# Patient Record
Sex: Male | Born: 1990 | Race: White | Hispanic: No | Marital: Single | Smoking: Former smoker
Health system: Southern US, Community
[De-identification: ages and names within clinical notes are randomized; demographics above are authoritative.]

## PROBLEM LIST (undated history)

## (undated) HISTORY — PX: FOOT SURGERY: SHX648

---
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
## 2010-02-24 ENCOUNTER — Emergency Department: Payer: Self-pay | Admitting: Emergency Medicine

## 2010-09-14 ENCOUNTER — Emergency Department: Payer: Self-pay | Admitting: Emergency Medicine

## 2011-02-04 ENCOUNTER — Emergency Department: Payer: Self-pay | Admitting: Emergency Medicine

## 2012-08-10 ENCOUNTER — Emergency Department: Payer: Self-pay | Admitting: Emergency Medicine

## 2014-08-10 ENCOUNTER — Ambulatory Visit: Payer: Self-pay

## 2015-07-14 ENCOUNTER — Ambulatory Visit
Admission: EM | Admit: 2015-07-14 | Discharge: 2015-07-14 | Disposition: A | Discharge status: Home / Self Care | Payer: BLUE CROSS/BLUE SHIELD | Attending: Family Medicine | Admitting: Family Medicine

## 2015-07-14 ENCOUNTER — Encounter: Payer: Self-pay | Admitting: Emergency Medicine

## 2015-07-14 ENCOUNTER — Ambulatory Visit: Payer: BLUE CROSS/BLUE SHIELD

## 2015-07-14 DIAGNOSIS — S6391XA Sprain of unspecified part of right wrist and hand, initial encounter: Secondary | ICD-10-CM | POA: Diagnosis not present

## 2015-07-14 DIAGNOSIS — T148 Other injury of unspecified body region: Secondary | ICD-10-CM

## 2015-07-14 DIAGNOSIS — M79641 Pain in right hand: Secondary | ICD-10-CM | POA: Diagnosis present

## 2015-07-14 DIAGNOSIS — T148XXA Other injury of unspecified body region, initial encounter: Secondary | ICD-10-CM

## 2015-07-14 DIAGNOSIS — S6991XA Unspecified injury of right wrist, hand and finger(s), initial encounter: Secondary | ICD-10-CM

## 2015-07-14 DIAGNOSIS — W109XXA Fall (on) (from) unspecified stairs and steps, initial encounter: Secondary | ICD-10-CM | POA: Insufficient documentation

## 2015-07-14 MED ORDER — OXYCODONE-ACETAMINOPHEN 2.5-325 MG PO TABS: 1.0000 | ORAL_TABLET | Freq: Four times a day (QID) | ORAL | Status: AC | PRN

## 2015-07-14 NOTE — ED Notes (Addendum)
Fell down stairs today injured thumb on right hand. Cannot move thumb, swollen and painful

## 2015-07-14 NOTE — ED Provider Notes (Signed)
Patient presents today with history of falling down the stairs earlier today. Patient states that he injured his right hand and first digit. He has noticed that his thumb has been swelling and is more tender now. He denies any previous injury to the hand or thumb in the past.  ROS: Negative except mentioned above. Vitals as per EPIC  GENERAL: NAD RESP: CTA B CARD: RRR MSK : Right Hand- moderate swelling and tenderness to the first metacarpal area and first digit, decreased range of motion due to pain and swelling, and NV intact, unable to assess ulnar collateral ligament disruption due to swelling and pain  NEURO: CN II-XII grossly intact   A/P: Right hand injury-x-rays do not show an acute fracture, will treat injury as a sprain and contusion, recommend patient ice the area frequently and take ibuprofen and Percocet if needed for increased pain. He is to use the thumb spica splint as well. He'll return on Wednesday so I can reevaluate his hand and see if he has any ligamentous injury that would require orthopedic evaluation/treatment. If his symptoms do get worse he is to seek immediate medical attention as discussed.  Jolene Provost, MD 07/14/15 [phone]

## 2016-04-14 ENCOUNTER — Emergency Department
Admission: EM | Admit: 2016-04-14 | Discharge: 2016-04-14 | Disposition: A | Discharge status: Home / Self Care | Payer: BLUE CROSS/BLUE SHIELD | Attending: Emergency Medicine | Admitting: Emergency Medicine

## 2016-04-14 ENCOUNTER — Emergency Department: Payer: BLUE CROSS/BLUE SHIELD

## 2016-04-14 DIAGNOSIS — R079 Chest pain, unspecified: Secondary | ICD-10-CM

## 2016-04-14 DIAGNOSIS — R0789 Other chest pain: Secondary | ICD-10-CM | POA: Insufficient documentation

## 2016-04-14 DIAGNOSIS — Z87891 Personal history of nicotine dependence: Secondary | ICD-10-CM | POA: Insufficient documentation

## 2016-04-14 LAB — CBC
HEMATOCRIT: 43.2 % (ref 40.0–52.0)
HEMOGLOBIN: 15.1 g/dL (ref 13.0–18.0)
MCH: 29.8 pg (ref 26.0–34.0)
MCHC: 35 g/dL (ref 32.0–36.0)
MCV: 85.1 fL (ref 80.0–100.0)
Platelets: 235 10*3/uL (ref 150–440)
RBC: 5.08 MIL/uL (ref 4.40–5.90)
RDW: 13.3 % (ref 11.5–14.5)
WBC: 10.5 10*3/uL (ref 3.8–10.6)

## 2016-04-14 LAB — BASIC METABOLIC PANEL
ANION GAP: 7 (ref 5–15)
BUN: 16 mg/dL (ref 6–20)
CHLORIDE: 105 mmol/L (ref 101–111)
CO2: 26 mmol/L (ref 22–32)
Calcium: 9.5 mg/dL (ref 8.9–10.3)
Creatinine, Ser: 0.85 mg/dL (ref 0.61–1.24)
Glucose, Bld: 86 mg/dL (ref 65–99)
POTASSIUM: 3.8 mmol/L (ref 3.5–5.1)
SODIUM: 138 mmol/L (ref 135–145)

## 2016-04-14 LAB — TROPONIN I

## 2016-04-14 MED ORDER — HYDROCODONE-ACETAMINOPHEN 5-325 MG PO TABS: 1.0000 | ORAL_TABLET | ORAL | Status: AC | PRN

## 2016-04-14 NOTE — ED Provider Notes (Signed)
[facility] Emergency Department Provider Note  Time seen: 10:12 PM  I have reviewed the triage vital signs and the nursing notes.   HISTORY  Chief Complaint Chest Pain    HPI Joe Reed is a 25 y.o. male with no past medical history who presents the emergency department left-sided chest pain according to the patient he has been having sharp moderate left-sided chest pain since yesterday. States the pain is worsened throughout the day. States it is much worse with movement and the more he moves the mortise hurt him. Denies any shortness of breath, nausea or diaphoresis. Patient states the pain is much worse when he moves his left arm, better. Pulses left arm against the chest. Denies any known trauma, denies any known incident that would resulted in muscular strain.Describes the pain as mild at rest, moderate anytime he moves his upper extremity or chest. Denies any pain with deep breathing but does state pain with cough or sneezing.     No past medical history on file.  There are no active problems to display for this patient.   Past Surgical History  Procedure Laterality Date  . No past surgeries      Current Outpatient Rx  Name  Route  Sig  Dispense  Refill  . oxycodone-acetaminophen (PERCOCET) 2.5-325 MG per tablet   Oral   Take 1 tablet by mouth every 6 (six) hours as needed for pain.   12 tablet   0     Allergies Ceclor  No family history on file.  Social History Social History  Substance Use Topics  . Smoking status: Former Games developer  . Smokeless tobacco: Never Used  . Alcohol Use: Yes    Review of Systems Constitutional: Negative for fever Cardiovascular: Left chest pain worse with movement. Respiratory: Negative for shortness of breath. Gastrointestinal: Negative for abdominal pain Musculoskeletal: Negative for back pain. Neurological: Negative for headache 10-point ROS otherwise  negative.  ____________________________________________   PHYSICAL EXAM:  VITAL SIGNS: ED Triage Vitals  Enc Vitals Group     BP 04/14/16 2109 143/92 mmHg     Pulse Rate 04/14/16 2109 60     Resp --      Temp 04/14/16 2109 98.2 F (36.8 C)     Temp Source 04/14/16 2109 Oral     SpO2 04/14/16 2109 98 %     Weight --      Height --      Head Cir --      Peak Flow --      Pain Score 04/14/16 2059 6     Pain Loc --      Pain Edu? --      Excl. in GC? --     Constitutional: Alert and oriented. Well appearing and in no distress. Eyes: Normal exam ENT   Head: Normocephalic and atraumatic.   Mouth/Throat: Mucous membranes are moist. Cardiovascular: Normal rate, regular rhythm. No murmur Respiratory: Normal respiratory effort without tachypnea nor retractions. Breath sounds are clear  Gastrointestinal: Soft and nontender. No distention.   Musculoskeletal: Nontender with normal range of motion in all extremities. No lower extremity tenderness or edema. Neurologic:  Normal speech and language. No gross focal neurologic deficits  Skin:  Skin is warm, dry and intact.  Psychiatric: Mood and affect are normal.  ____________________________________________    EKG  EKG reviewed and interpreted by myself shows normal sinus rhythm at 66 bpm, narrow QRS, normal axis, normal intervals, no ST changes. Normal EKG.  ____________________________________________    RADIOLOGY  Chest x-ray negative  ____________________________________________   INITIAL IMPRESSION / ASSESSMENT AND PLAN / ED COURSE  Pertinent labs & imaging results that were available during my care of the patient were reviewed by me and considered in my medical decision making (see chart for details).  Patient presents with left-sided chest pain since yesterday, much worse with movement. Reproducible with movement. Labs including troponin are negative. EKG is normal. Chest x-rays negative. PERC negative. we  will discharge the patient home with PCP follow-up in a short course of pain medication. Patient agreeable to plan.  ____________________________________________   FINAL CLINICAL IMPRESSION(S) / ED DIAGNOSES  Chest pain   Minna AntisKevin Ashlee Bewley, MD 04/14/16 2215

## 2016-04-14 NOTE — ED Notes (Signed)
Pt reports having a dull squeezing chest pain to the left side that started x1 day ago. Pt denies medical hx. Pt is 7/10 on pain scale.

## 2016-04-14 NOTE — ED Notes (Signed)
Pt in with co left sternal chest pain for few days states worse on movement.  States some shob at this time, no diaphoresis.

## 2016-04-14 NOTE — Discharge Instructions (Signed)
You have been seen in the emergency department today for chest pain. Your workup has shown normal results, and your pain is likely due to musculoskeletal strain. As we discussed please follow-up with your primary care physician in the next 1-2 days for recheck. Return to the emergency department for any further chest pain, trouble breathing, or any other symptom personally concerning to yourself.   Chest Wall Pain Chest wall pain is pain in or around the bones and muscles of your chest. Sometimes, an injury causes this pain. Sometimes, the cause may not be known. This pain may take several weeks or longer to get better. HOME CARE INSTRUCTIONS  Pay attention to any changes in your symptoms. Take these actions to help with your pain:   Rest as told by your health care provider.   Avoid activities that cause pain. These include any activities that use your chest muscles or your abdominal and side muscles to lift heavy items.   If directed, apply ice to the painful area:  Put ice in a plastic bag.  Place a towel between your skin and the bag.  Leave the ice on for 20 minutes, 2-3 times per day.  Take over-the-counter and prescription medicines only as told by your health care provider.  Do not use tobacco products, including cigarettes, chewing tobacco, and e-cigarettes. If you need help quitting, ask your health care provider.  Keep all follow-up visits as told by your health care provider. This is important. SEEK MEDICAL CARE IF:  You have a fever.  Your chest pain becomes worse.  You have new symptoms. SEEK IMMEDIATE MEDICAL CARE IF:  You have nausea or vomiting.  You feel sweaty or light-headed.  You have a cough with phlegm (sputum) or you cough up blood.  You develop shortness of breath.   This information is not intended to replace advice given to you by your health care provider. Make sure you discuss any questions you have with your health care provider.   Document  Released: 11/10/2005 Document Revised: 08/01/2015 Document Reviewed: 02/05/2015 Elsevier Interactive Patient Education [employer]2016 [employer] Inc[employer]

## 2017-01-12 ENCOUNTER — Encounter: Payer: Self-pay | Admitting: *Deleted

## 2017-01-12 ENCOUNTER — Ambulatory Visit
Admission: EM | Admit: 2017-01-12 | Discharge: 2017-01-12 | Disposition: A | Discharge status: Home / Self Care | Payer: BLUE CROSS/BLUE SHIELD | Attending: Family Medicine | Admitting: Family Medicine

## 2017-01-12 DIAGNOSIS — J0100 Acute maxillary sinusitis, unspecified: Secondary | ICD-10-CM | POA: Diagnosis not present

## 2017-01-12 DIAGNOSIS — H1032 Unspecified acute conjunctivitis, left eye: Secondary | ICD-10-CM

## 2017-01-12 MED ORDER — FEXOFENADINE-PSEUDOEPHED ER 180-240 MG PO TB24: 1.0000 | ORAL_TABLET | Freq: Every day | ORAL | 0 refills | Status: AC

## 2017-01-12 MED ORDER — AMOXICILLIN-POT CLAVULANATE 875-125 MG PO TABS: 1.0000 | ORAL_TABLET | Freq: Two times a day (BID) | ORAL | 0 refills | Status: AC

## 2017-01-12 NOTE — ED Triage Notes (Signed)
Patient started having symptom of left eye redness and itching last PM. Patient reports additional symptom of sinus pressure and drainage that started 1 week ago.

## 2017-01-12 NOTE — ED Provider Notes (Signed)
[facility]    CSN: [id number] Arrival date & time: 01/12/17  1108     History   Chief Complaint Chief Complaint  Patient presents with  . Eye Problem    HPI Joe Reed is a 26 y.o. male.   Patient's here because of redness of the left eye. He states the left eye was irritated before to shower last night initially thought that they have just gotten some water but this morning woke up the left eye being matted down with gunk and gook being present in the left eye. He reports some nasal congestion for little bit over week started before Valentine's Day he's had nasal congestion difficult to breathing through his nostrils with she's had a fracture of his nose and also pressure behind his eyes and along her sinuses as well. We does blow his nose and get something out is usually bloody. The sore throat. No fever. He is allergic to Ceclor only surgeries foot surgery and no pertinent past family medical history relevant to today's visit. He does not smoke.   The history is provided by the patient. No language interpreter was used.  Eye Problem  Location:  Left eye Quality:  Burning Severity:  Moderate Onset quality:  Sudden Timing:  Constant Chronicity:  New Context: not burn and not smoke exposure   Relieved by:  Nothing Worsened by:  Nothing Associated symptoms: headaches and redness   Associated symptoms: no blurred vision and no nausea     History reviewed. No pertinent past medical history.  There are no active problems to display for this patient.   Past Surgical History:  Procedure Laterality Date  . FOOT SURGERY         Home Medications    Prior to Admission medications   Medication Sig Start Date End Date Taking? Authorizing Provider  amoxicillin-clavulanate (AUGMENTIN) 875-125 MG tablet Take 1 tablet by mouth 2 (two) times daily. 01/12/17   Hassan Rowan, MD  fexofenadine-pseudoephedrine (ALLEGRA-D ALLERGY & CONGESTION) 180-240 MG 24 hr tablet  Take 1 tablet by mouth daily. 01/12/17   Hassan Rowan, MD  HYDROcodone-acetaminophen (NORCO/VICODIN) 5-325 MG tablet Take 1 tablet by mouth every 4 (four) hours as needed. 04/14/16   Minna Antis, MD  oxycodone-acetaminophen (PERCOCET) 2.5-325 MG per tablet Take 1 tablet by mouth every 6 (six) hours as needed for pain. 07/14/15   Jolene Provost, MD    Family History History reviewed. No pertinent family history.  Social History Social History  Substance Use Topics  . Smoking status: Former Games developer  . Smokeless tobacco: Never Used  . Alcohol use Yes     Allergies   Ceclor [cefaclor]   Review of Systems Review of Systems  HENT: Positive for nosebleeds, rhinorrhea, sinus pain and sinus pressure. Negative for sore throat.   Eyes: Positive for redness. Negative for blurred vision.  Gastrointestinal: Negative for nausea.  Neurological: Positive for headaches.  All other systems reviewed and are negative.    Physical Exam Triage Vital Signs ED Triage Vitals  Enc Vitals Group     BP 01/12/17 1122 127/77     Pulse Rate 01/12/17 1122 65     Resp 01/12/17 1122 16     Temp 01/12/17 1122 98.1 F (36.7 C)     Temp Source 01/12/17 1122 Oral     SpO2 01/12/17 1122 99 %     Weight 01/12/17 1123 220 lb (99.8 kg)     Height 01/12/17 1123 6\' 3"  (1.905 m)  Head Circumference --      Peak Flow --      Pain Score 01/12/17 1126 0     Pain Loc --      Pain Edu? --      Excl. in GC? --    No data found.   Updated Vital Signs BP 127/77 (BP Location: Left Arm)   Pulse 65   Temp 98.1 F (36.7 C) (Oral)   Resp 16   Ht 6\' 3"  (1.905 m)   Wt 220 lb (99.8 kg)   SpO2 99%   BMI 27.50 kg/m   Visual Acuity Right Eye Distance:   Left Eye Distance:   Bilateral Distance:    Right Eye Near:   Left Eye Near:    Bilateral Near:     Physical Exam  Constitutional: He is oriented to person, place, and time. He appears well-developed and well-nourished.  HENT:  Head: Normocephalic and  atraumatic.  Right Ear: Hearing, tympanic membrane, external ear and ear canal normal.  Left Ear: Hearing, tympanic membrane and ear canal normal.  Nose: Nasal deformity present. Right sinus exhibits maxillary sinus tenderness and frontal sinus tenderness. Left sinus exhibits maxillary sinus tenderness and frontal sinus tenderness.  Mouth/Throat: Uvula is midline and mucous membranes are normal. No uvula swelling.  Eyes: Pupils are equal, round, and reactive to light.  Neck: Normal range of motion. Neck supple.  Pulmonary/Chest: Effort normal.  Musculoskeletal: Normal range of motion.  Neurological: He is alert and oriented to person, place, and time.  Skin: Skin is warm.  Psychiatric: He has a normal mood and affect.  Vitals reviewed.    UC Treatments / Results  Labs (all labs ordered are listed, but only abnormal results are displayed) Labs Reviewed - No data to display  EKG  EKG Interpretation None       Radiology No results found.  Procedures Procedures (including critical care time)  Medications Ordered in UC Medications - No data to display   Initial Impression / Assessment and Plan / UC Course  I have reviewed the triage vital signs and the nursing notes.  Pertinent labs & imaging results that were available during my care of the patient were reviewed by me and considered in my medical decision making (see chart for details).   will place him on Augmentin 875 one tablet twice a day Allegra-D 1 tablet daily work note for the conjunctivitis for today and tomorrow and inform patient that with oral antibiotics eyedrops should not be needed at this time. Follow-up by Dr. not improving   Final Clinical Impressions(s) / UC Diagnoses   Final diagnoses:  Acute bacterial conjunctivitis of left eye  Acute maxillary sinusitis, recurrence not specified    New Prescriptions New Prescriptions   AMOXICILLIN-CLAVULANATE (AUGMENTIN) 875-125 MG TABLET    Take 1 tablet by  mouth 2 (two) times daily.   FEXOFENADINE-PSEUDOEPHEDRINE (ALLEGRA-D ALLERGY & CONGESTION) 180-240 MG 24 HR TABLET    Take 1 tablet by mouth daily.     Note: This dictation was prepared with Dragon dictation along with smaller phrase technology. Any transcriptional errors that result from this process are unintentional.    Hassan RowanEugene Lillyann Ahart, MD 01/12/17 1242

## 2017-01-20 IMAGING — CR DG CHEST 2V
1 series · 2 of 2 positions shown · non-contrast
Comparison: None.

CLINICAL DATA: Left-sided chest pain for days. Shortness of breath.
Progressive symptoms over the last 9 hours.

EXAM:
CHEST  2 VIEW

[Series 1: w chest pa · 0.14mm/px · 2 of 2 slices shown]
[im 1/2]
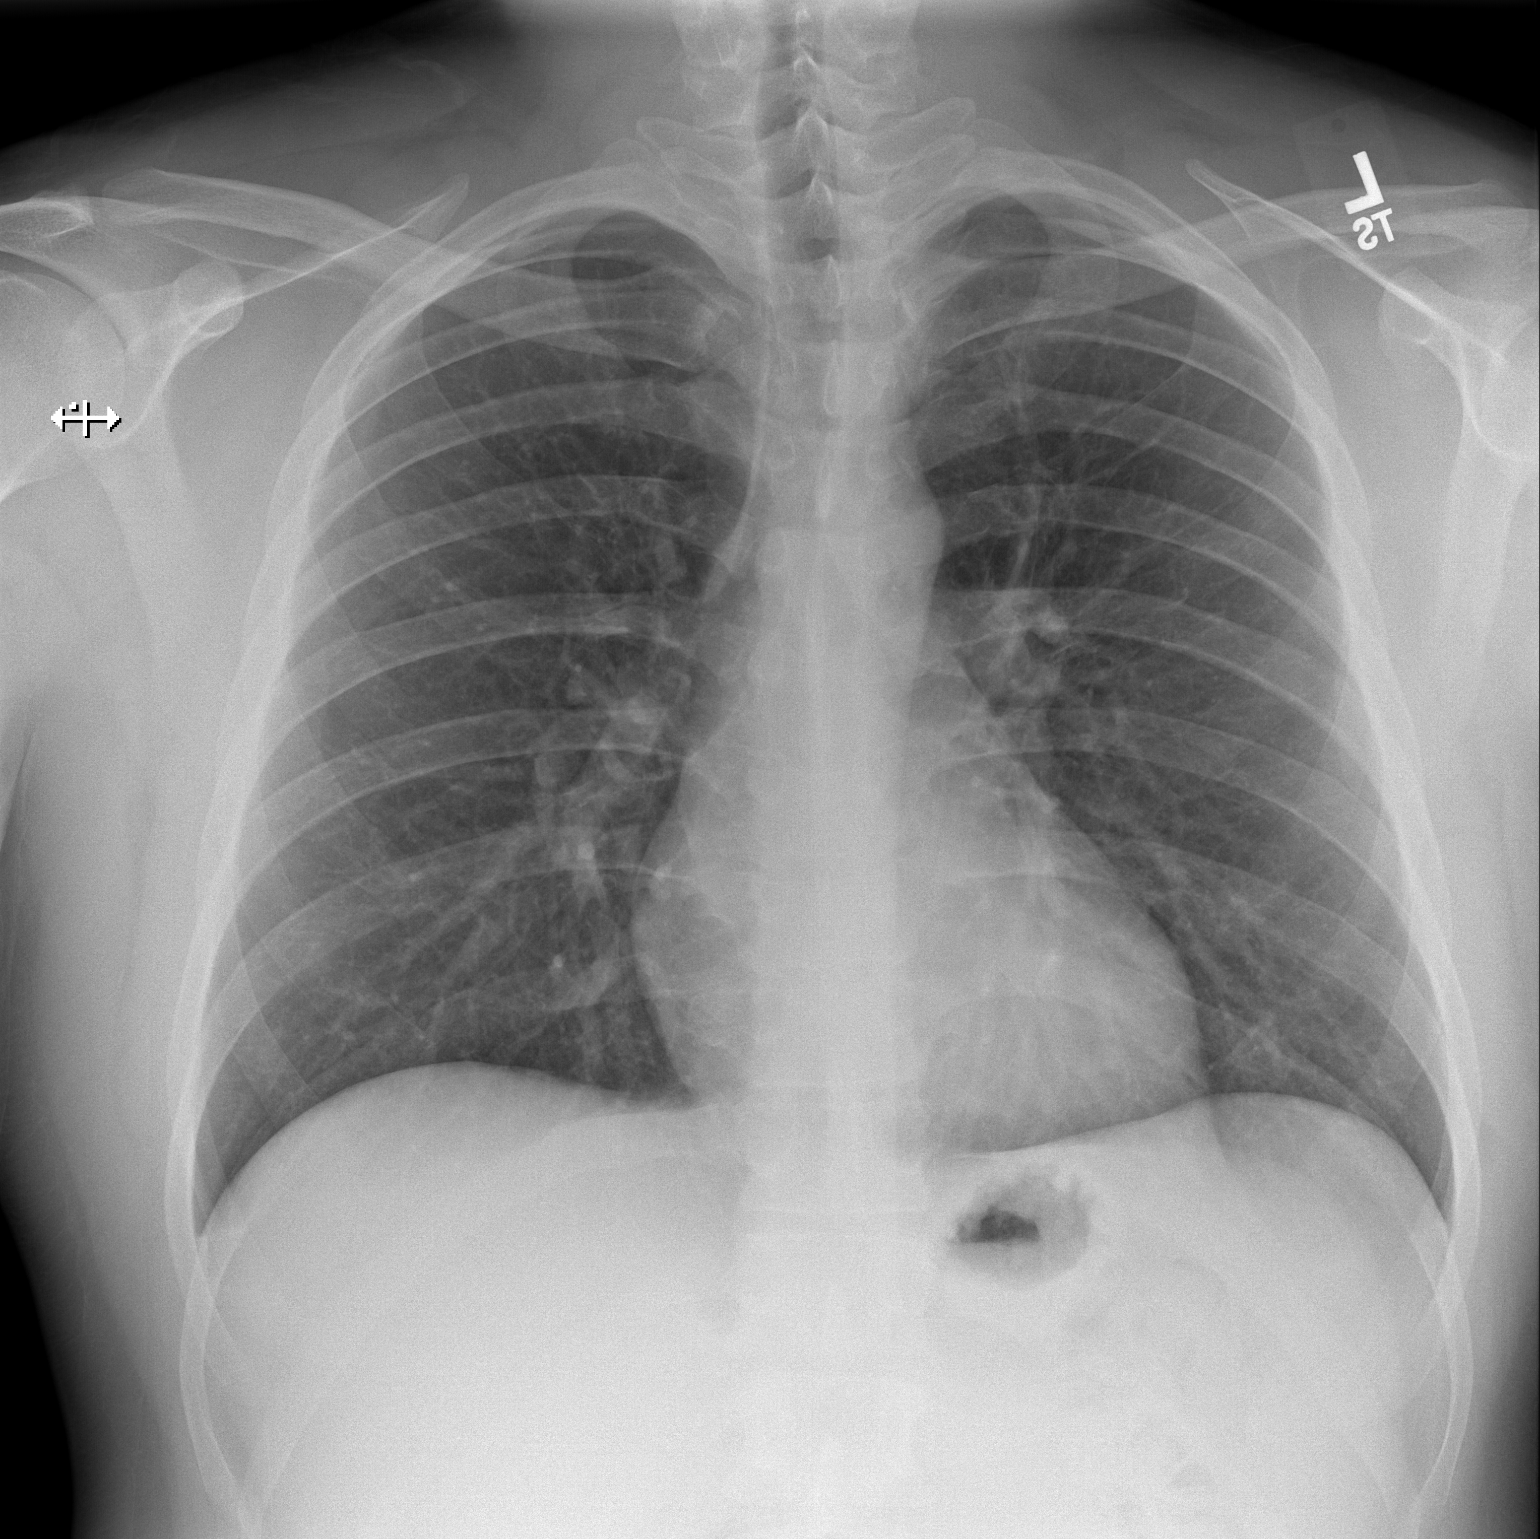
[im 2/2]
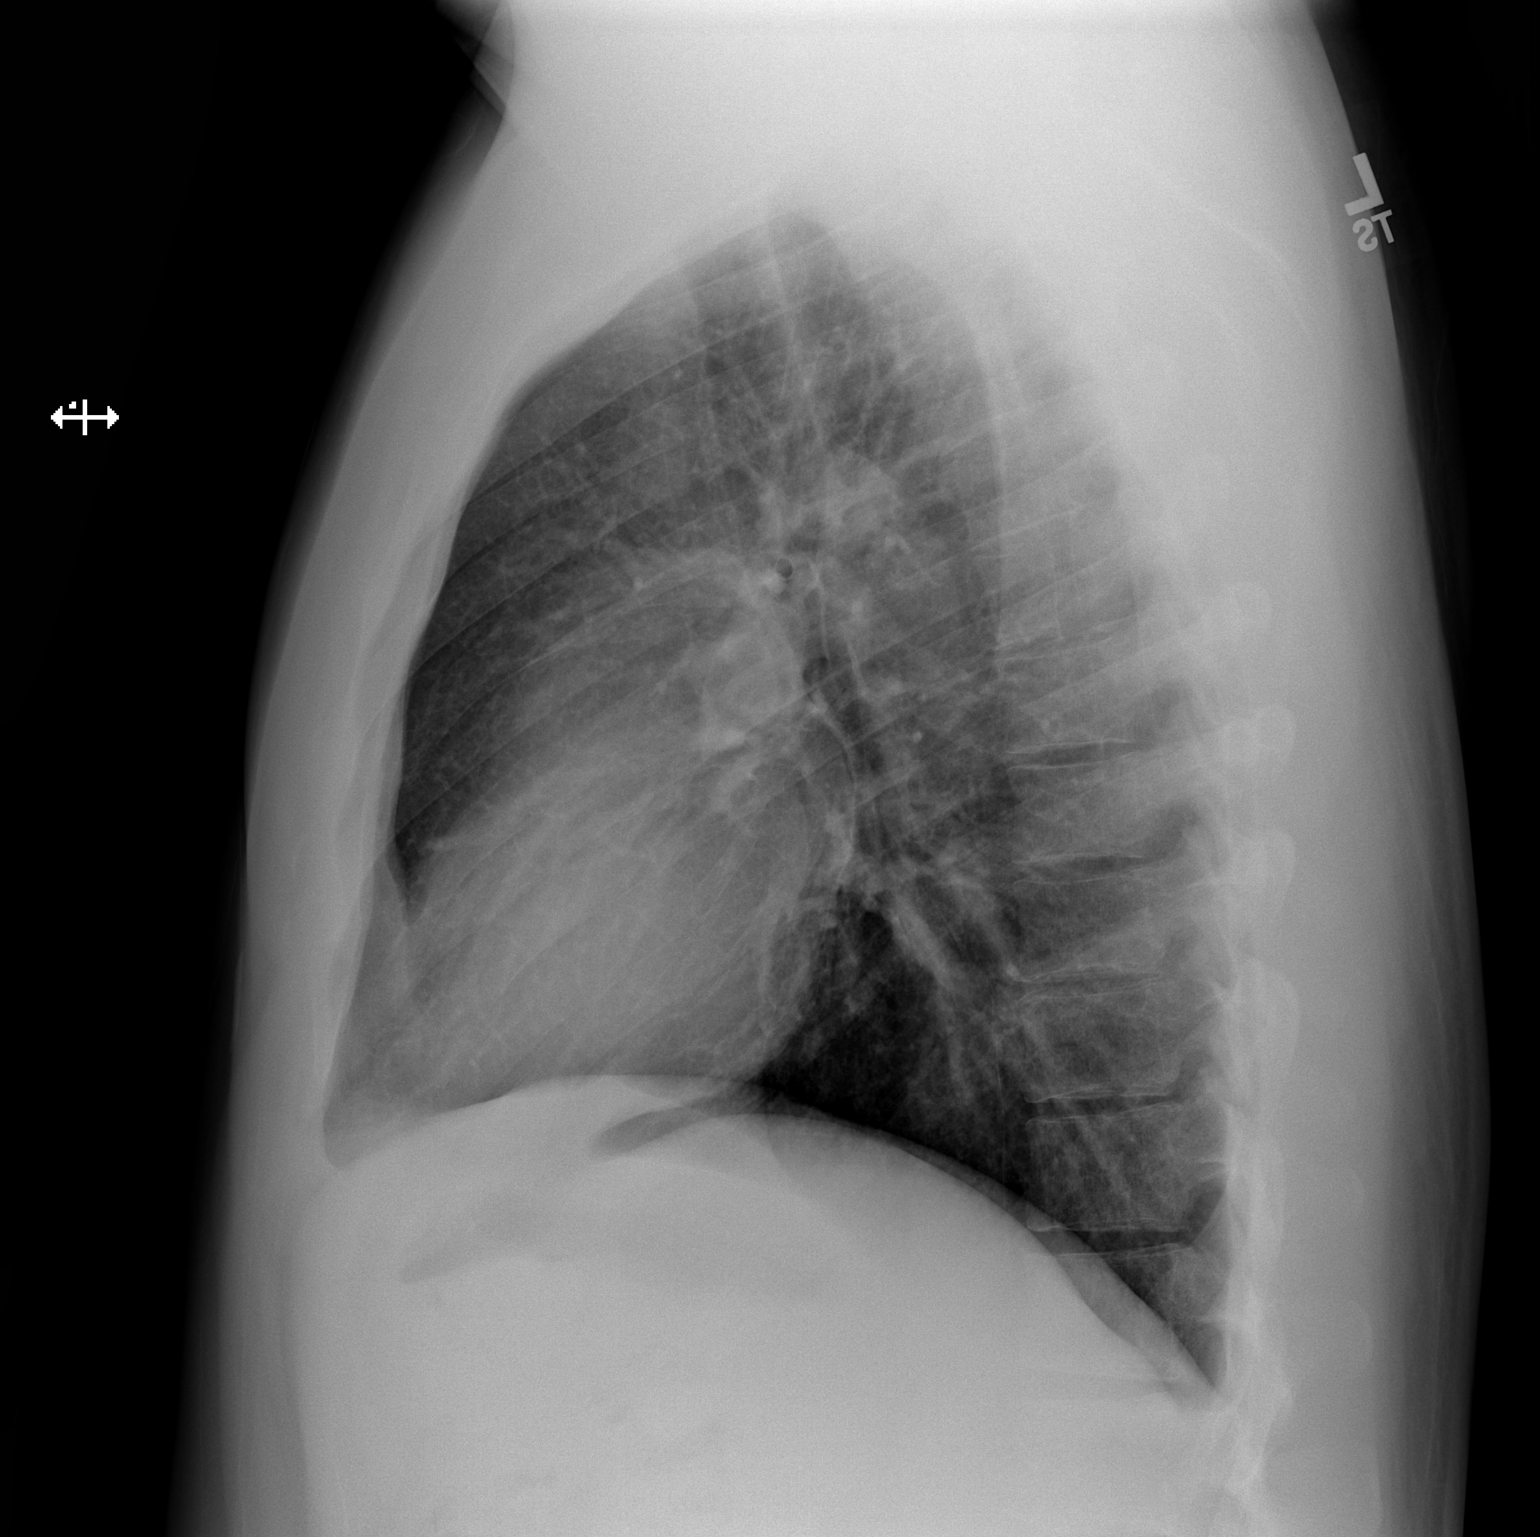

[2 of 2 positions shown; findings below may reference images not displayed]

FINDINGS: The cardiomediastinal contours are normal. The lungs are clear.
Pulmonary vasculature is normal. No consolidation, pleural effusion,
or pneumothorax. No acute osseous abnormalities are seen.
IMPRESSION: No active cardiopulmonary disease.

## 2018-02-15 ENCOUNTER — Other Ambulatory Visit: Payer: Self-pay

## 2018-02-15 ENCOUNTER — Emergency Department
Admission: EM | Admit: 2018-02-15 | Discharge: 2018-02-15 | Disposition: A | Discharge status: Home / Self Care | Payer: Self-pay | Attending: Emergency Medicine | Admitting: Emergency Medicine

## 2018-02-15 ENCOUNTER — Encounter: Payer: Self-pay | Admitting: Emergency Medicine

## 2018-02-15 DIAGNOSIS — Z5321 Procedure and treatment not carried out due to patient leaving prior to being seen by health care provider: Secondary | ICD-10-CM | POA: Insufficient documentation

## 2018-02-15 DIAGNOSIS — F29 Unspecified psychosis not due to a substance or known physiological condition: Secondary | ICD-10-CM | POA: Insufficient documentation

## 2018-02-15 LAB — COMPREHENSIVE METABOLIC PANEL
ALK PHOS: 62 U/L (ref 38–126)
ALT: 24 U/L (ref 17–63)
AST: 24 U/L (ref 15–41)
Albumin: 4.9 g/dL (ref 3.5–5.0)
Anion gap: 9 (ref 5–15)
BUN: 10 mg/dL (ref 6–20)
CALCIUM: 9.3 mg/dL (ref 8.9–10.3)
CHLORIDE: 103 mmol/L (ref 101–111)
CO2: 27 mmol/L (ref 22–32)
Creatinine, Ser: 0.93 mg/dL (ref 0.61–1.24)
GFR calc Af Amer: >60 mL/min (ref >60)
GFR calc non Af Amer: >60 mL/min (ref >60)
GLUCOSE: 76 mg/dL (ref 65–99)
Potassium: 4 mmol/L (ref 3.5–5.1)
SODIUM: 139 mmol/L (ref 135–145)
Total Bilirubin: 0.5 mg/dL (ref 0.3–1.2)
Total Protein: 8.4 g/dL — ABNORMAL HIGH (ref 6.5–8.1)

## 2018-02-15 LAB — CBC
HEMATOCRIT: 49.1 % (ref 40.0–52.0)
HEMOGLOBIN: 16.6 g/dL (ref 13.0–18.0)
MCH: 30.1 pg (ref 26.0–34.0)
MCHC: 33.7 g/dL (ref 32.0–36.0)
MCV: 89.2 fL (ref 80.0–100.0)
Platelets: 241 10*3/uL (ref 150–440)
RBC: 5.51 MIL/uL (ref 4.40–5.90)
RDW: 13.8 % (ref 11.5–14.5)
WBC: 13.3 10*3/uL — ABNORMAL HIGH (ref 3.8–10.6)

## 2018-02-15 LAB — ETHANOL: Alcohol, Ethyl (B): 98 mg/dL — ABNORMAL HIGH (ref <10)

## 2018-02-15 LAB — ACETAMINOPHEN LEVEL: Acetaminophen (Tylenol), Serum: <10 ug/mL — ABNORMAL LOW (ref 10–30)

## 2018-02-15 LAB — SALICYLATE LEVEL: Salicylate Lvl: <7 mg/dL (ref 2.8–30.0)

## 2018-02-15 NOTE — ED Triage Notes (Signed)
States has had suicidal ideation today. States does have a plan. States has not done anything to harm self at this point.

## 2018-02-15 NOTE — ED Triage Notes (Signed)
Patient states during triage that he has made statements but that he does not have a plan and that he would not do anything to harm himself. Patient is aware he is not on a hold. Mom is with patient and states this is consistent with his history of depression but not self harm and that she will take patient home. Patient refuses to be seen. Leaves with mom.
